# Patient Record
Sex: Male | Born: 1980 | Race: White | Hispanic: No | Marital: Married | Smoking: Never smoker
Health system: Southern US, Community
[De-identification: ages and names within clinical notes are randomized; demographics above are authoritative.]

## PROBLEM LIST (undated history)

## (undated) DIAGNOSIS — T7840XA Allergy, unspecified, initial encounter: Secondary | ICD-10-CM

## (undated) DIAGNOSIS — R519 Headache, unspecified: Secondary | ICD-10-CM

## (undated) DIAGNOSIS — G4730 Sleep apnea, unspecified: Secondary | ICD-10-CM

## (undated) HISTORY — DX: Headache, unspecified: R51.9

## (undated) HISTORY — DX: Allergy, unspecified, initial encounter: T78.40XA

## (undated) HISTORY — DX: Sleep apnea, unspecified: G47.30

---

## 2019-03-23 ENCOUNTER — Encounter: Payer: Self-pay | Admitting: Family Medicine

## 2019-03-23 ENCOUNTER — Other Ambulatory Visit: Payer: Self-pay

## 2019-03-23 ENCOUNTER — Ambulatory Visit (INDEPENDENT_AMBULATORY_CARE_PROVIDER_SITE_OTHER): Payer: Managed Care, Other (non HMO) | Admitting: Family Medicine

## 2019-03-23 VITALS — BP 118/70 | HR 88 | Ht 71.0 in | Wt 224.0 lb

## 2019-03-23 DIAGNOSIS — Z7689 Persons encountering health services in other specified circumstances: Secondary | ICD-10-CM

## 2019-03-23 DIAGNOSIS — G4484 Primary exertional headache: Secondary | ICD-10-CM | POA: Diagnosis not present

## 2019-03-23 DIAGNOSIS — Z23 Encounter for immunization: Secondary | ICD-10-CM | POA: Diagnosis not present

## 2019-03-23 DIAGNOSIS — R400 Somnolence: Secondary | ICD-10-CM

## 2019-03-23 DIAGNOSIS — G4733 Obstructive sleep apnea (adult) (pediatric): Secondary | ICD-10-CM | POA: Diagnosis not present

## 2019-03-23 MED ORDER — MODAFINIL 200 MG PO TABS: 200.0000 mg | ORAL_TABLET | Freq: Every day | ORAL | 5 refills | Status: DC

## 2019-03-23 MED ORDER — INDOMETHACIN 25 MG PO CAPS: 25.0000 mg | ORAL_CAPSULE | Freq: Two times a day (BID) | ORAL | 0 refills | Status: DC

## 2019-03-23 NOTE — Patient Instructions (Signed)
Indomethacin-Responsive Headache, Adult An indomethacin-responsive headache is a headache that gets better when you take indomethacin. Indomethacin is a kind of NSAID (nonsteroidal anti-inflammatory drug). Indomethacin can quickly stop the pain from some kinds of headaches, such as:  Paroxysmal hemicrania. This is a series of short, severe headaches, usually on just one side of the head.  Hemicrania continua. Pain is nonstop and on one side of the face.  Primary exertional headache. Exercise sets off these headaches.  Primary cough headache. Pain may come from pressure in the brain when coughing or straining. What are the causes? The exact cause of this condition is not known. Certain conditions may start (trigger) a headache. They include:  Moving the head in certain ways.  Stress.  Pressure on sensitive areas of the neck.  Drinking alcohol.  Exercise.  Coughing and sneezing. What increases the risk? The following factors may make you more likely to develop this condition:  Being 52 years of age or older.  Having a serious head injury.  Having migraine headaches.  Having a family history of this condition. What are the signs or symptoms? Symptoms of this condition depend on the kind of headache you have.  Paroxysmal hemicrania: ? Having about 10 headaches a day. Each may last from a few minutes to 2 hours. ? Severe, pounding pain. ? Pain usually on just one side of the head. It often centers around the eye or in the forehead. ? A watery eye, which may become red or swollen. ? A droopy or swollen eyelid. ? Sweating and having a red or pinkish face. ? A stuffy, runny nose.  Hemicrania continua: ? All-day headache. This may occur daily for at least 3 months. Then there may be no headaches for weeks or months. ? Pain that gets worse several times during the day. ? Pain in the face, on one side only. It almost always occurs on the same side. ? A watery eye. It may also  become droopy, red, and swollen. ? A stuffy, runny nose. ? Pain that gets worse with sound or light.  Primary exertional headache: ? Pain during physical activity. ? Pounding or throbbing pain. ? Pain that lasts for 5 minutes to 48 hours, or sometimes longer.  Primary cough headache: ? Pain that starts after coughing, sneezing, or straining. ? Sharp, stabbing pain. ? Pain on both sides of the head. It is often worse in the back of the head. ? Pain that is severe for a few minutes and then dull for several hours. How is this diagnosed? This condition may be diagnosed based on:  Symptoms and medical history. Your health care provider will ask you questions about your headaches.  Physical exam.  Tests that may include: ? Blood and urine tests. ? Spinal tap (lumbar puncture). This tests a sample of fluid from your spine. The test checks for infection, bleeding in your brain (brain hemorrhage), or extra pressure inside your skull. ? Ultrasound, MRI, CT scan, or other imaging tests. If no medical condition is causing your headaches, you will be given indomethacin. If yours is an indomethacin-responsive headache, your symptoms should go away quickly. How is this treated?  By taking indomethacin.  With other medicines: ? To prevent or treat stomach ulcers. ? To relieve stomachache or heartburn (antacids). ? For nausea. Follow these instructions at home: Lifestyle  Rest in a dark, quiet room.  Put a cool, damp washcloth on your head or face.  Get plenty of sleep. Most adults should get  at least 7-9 hours of sleep each night.  Eat on a regular schedule. Do not skip meals.  Limit alcohol intake to no more than 1 drink per day for non-pregnant women and 2 drinks per day for men. One drink equals 12 ounces of beer, 5 ounces of wine, or 1 ounces of hard liquor.  Do not use any products that contain nicotine or tobacco, such as cigarettes and e-cigarettes. If you need help quitting,  ask your health care provider. Headache diary Keep a headache diary. This will help you and your health care provider determine what is triggering your headaches. Each time you have a headache, write down:  When it started and stopped. Include the day and time.  How it felt.  Any triggers, such as noise, stress, or foods.  Any medicines you took.  General instructions  Take over-the-counter and prescription medicines only as told by your health care provider. ? Do not take other NSAIDs, such as ibuprofen, with indomethacin.  Tell your health care provider about all medicines you are taking, including vitamins, herbs, eye drops, creams, and over-the-counter medicines.  Keep all follow-up visits as told by your health care provider. This is important. Contact a health care provider if:  Your pain continues even with treatment.  You have nausea.  You have a fever. Get help right away if:  You have bad stomach pain or vomiting.  You vomit blood.  You have blood in your stool.  You have chest pain.  You have any symptoms of a stroke. "BE FAST" is an easy way to remember the main warning signs of a stroke: ? B - Balance. Signs are dizziness, sudden trouble walking, or loss of balance. ? E - Eyes. Signs are trouble seeing or a sudden change in vision. ? F - Face. Signs are sudden weakness or numbness of the face, or the face or eyelid drooping on one side. ? A - Arms. Signs are weakness or numbness in an arm. This happens suddenly and usually on one side of the body. ? S - Speech. Signs are sudden trouble speaking, slurred speech, or trouble understanding what people say. ? T - Time. Time to call emergency services. Write down what time symptoms started.  You have other signs of a stroke, such as: ? A sudden, severe headache. ? Nausea or vomiting. ? Seizure. Summary  An indomethacin-responsive headache is a headache that gets better when you take indomethacin, a medicine  that stops inflammation.  The exact cause of this condition is not known, but there are certain conditions that may start (trigger) a headache.  Keep a headache diary to help your health care provider determine your triggers.  Treatment of this condition includes indomethacin, but it may include other medicines to relieve other symptoms. This information is not intended to replace advice given to you by your health care provider. Make sure you discuss any questions you have with your health care provider. Document Released: 06/30/2009 Document Revised: 09/04/2018 Document Reviewed: 07/23/2017 Elsevier Patient Education  2020 [employer].

## 2019-03-23 NOTE — Progress Notes (Signed)
Date:  03/23/2019   Name:  George Mercer   DOB:  [birth year]   MRN:  [id number]   Chief Complaint: Establish Care and influenza vacc need  Patient is a 38 year old male who presents for an establish care exam. The patient reports the following problems:none . Health maintenance has been reviewed up to date.   Review of Systems  Constitutional: Negative for chills and fever.  HENT: Negative for drooling, ear discharge, ear pain, postnasal drip and sore throat.   Respiratory: Negative for cough, shortness of breath and wheezing.   Cardiovascular: Negative for chest pain, palpitations and leg swelling.  Gastrointestinal: Negative for abdominal pain, blood in stool, constipation, diarrhea and nausea.  Endocrine: Negative for polydipsia.  Genitourinary: Negative for dysuria, frequency, hematuria and urgency.  Musculoskeletal: Negative for back pain, myalgias and neck pain.  Skin: Negative for rash.  Allergic/Immunologic: Negative for environmental allergies.  Neurological: Positive for headaches. Negative for dizziness, seizures, speech difficulty and numbness.  Hematological: Does not bruise/bleed easily.  Psychiatric/Behavioral: Negative for suicidal ideas. The patient is not nervous/anxious.     There are no active problems to display for this patient.   No Known Allergies  History reviewed. No pertinent surgical history.  Social History   Tobacco Use  . Smoking status: Never Smoker  . Smokeless tobacco: Never Used  Substance Use Topics  . Alcohol use: Yes    Comment: 1-2 beers/ week  . Drug use: Never     Medication list has been reviewed and updated.  Current Meds  Medication Sig  . fluticasone (FLONASE) 50 MCG/ACT nasal spray Place 1 spray into both nostrils daily. otc    PHQ 2/9 Scores 03/23/2019  PHQ - 2 Score 0  PHQ- 9 Score 3    BP Readings from Last 3 Encounters:  03/23/19 118/70    Physical Exam Vitals signs and nursing note reviewed.  HENT:     Head: Normocephalic.     Right Ear: External ear normal.     Left Ear: External ear normal.     Nose: Nose normal.  Eyes:     General: No scleral icterus.       Right eye: No discharge.        Left eye: No discharge.     Conjunctiva/sclera: Conjunctivae normal.     Pupils: Pupils are equal, round, and reactive to light.  Neck:     Musculoskeletal: Normal range of motion and neck supple.     Thyroid: No thyromegaly.     Vascular: No JVD.     Trachea: No tracheal deviation.  Cardiovascular:     Rate and Rhythm: Normal rate and regular rhythm.     Heart sounds: Normal heart sounds. No murmur. No friction rub. No gallop.   Pulmonary:     Effort: No respiratory distress.     Breath sounds: Normal breath sounds. No wheezing or rales.  Abdominal:     General: Bowel sounds are normal.     Palpations: Abdomen is soft. There is no mass.     Tenderness: There is no abdominal tenderness. There is no guarding or rebound.  Musculoskeletal: Normal range of motion.        General: No tenderness.  Lymphadenopathy:     Cervical: No cervical adenopathy.  Skin:    General: Skin is warm.     Findings: No rash.  Neurological:     Mental Status: He is alert and oriented to person, place, and time.  Cranial Nerves: No cranial nerve deficit.     Deep Tendon Reflexes: Reflexes are normal and symmetric.     Wt Readings from Last 3 Encounters:  03/23/19 224 lb (101.6 kg)    BP 118/70   Pulse 88   Ht 5\' 11"  (1.803 m)   Wt 224 lb (101.6 kg)   BMI 31.24 kg/m   Assessment and Plan:  1. Establishing care with new doctor, encounter for Patient to establish care with new physician.  Reviewed patient's medical history and physical exam was unremarkable.  2. Obstructive sleep apnea syndrome Patient has a history of obstructive sleep apnea for which he is on CPAP.  Patient will be getting his settings so we can go ahead and put it in his media and for future purposes of if we need to order new  supplies.  At this time otherwise it may be difficult in the future  3. Daytime somnolence Patient is been on Provigil in the past 1-1/2 tablets daily we will will continue at the current dose of 1-1/2 tablet. - modafinil (PROVIGIL) 200 MG tablet; Take 1 tablet (200 mg total) by mouth daily. One and a half a day  Dispense: 45 tablet; Refill: 5  4. 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  We will try some indomethacin 25 mg twice a day either at the time of the early symptoms or prophylactic when he is working out in the gym's and maxing out on weights. - indomethacin (INDOCIN) 25 MG capsule; Take 1 capsule (25 mg total) by mouth 2 (two) times daily with a meal.  Dispense: 30 capsule; Refill: 0  5. Influenza vaccine needed Discussed and administered. - Flu Vaccine QUAD 6+ mos PF IM (Fluarix Quad PF)

## 2019-03-26 ENCOUNTER — Encounter: Payer: Self-pay | Admitting: Family Medicine

## 2019-03-30 ENCOUNTER — Other Ambulatory Visit: Payer: Self-pay

## 2019-03-30 DIAGNOSIS — R400 Somnolence: Secondary | ICD-10-CM

## 2019-03-30 MED ORDER — MODAFINIL 200 MG PO TABS: 200.0000 mg | ORAL_TABLET | Freq: Two times a day (BID) | ORAL | 5 refills | Status: DC

## 2019-04-03 ENCOUNTER — Telehealth: Payer: Self-pay

## 2019-04-03 NOTE — Telephone Encounter (Signed)
Pt called last week requesting #60 of Pristiq to be sent to pharmacy d/t #60 being "cheaper than #30". Pt was notified today on voicemail that we will be unable to continue to prescribe the twice a day dosing, as the PA for the 30 was denied. Offered to send him to the doctor of his choice, if he wanted this continued. Pt called back stating that he is "fighting the denial." But he "understood and didn't want to cause an issue for [place]." I called [facility] pharm and spoke to Megan/ told her to d/c the Pristiq they have on file, so there will not be 2 open prescriptions for this med.

## 2019-05-16 ENCOUNTER — Encounter: Payer: Self-pay | Admitting: Family Medicine

## 2019-05-16 ENCOUNTER — Other Ambulatory Visit: Payer: Self-pay

## 2019-05-16 ENCOUNTER — Ambulatory Visit (INDEPENDENT_AMBULATORY_CARE_PROVIDER_SITE_OTHER): Payer: Managed Care, Other (non HMO) | Admitting: Family Medicine

## 2019-05-16 VITALS — BP 118/77 | HR 88 | Resp 16 | Ht 71.0 in | Wt 228.0 lb

## 2019-05-16 DIAGNOSIS — J301 Allergic rhinitis due to pollen: Secondary | ICD-10-CM

## 2019-05-16 DIAGNOSIS — R400 Somnolence: Secondary | ICD-10-CM

## 2019-05-16 DIAGNOSIS — G4484 Primary exertional headache: Secondary | ICD-10-CM | POA: Diagnosis not present

## 2019-05-16 MED ORDER — AZELASTINE HCL 0.1 % NA SOLN: 1.0000 | Freq: Two times a day (BID) | NASAL | 12 refills | Status: DC

## 2019-05-16 MED ORDER — INDOMETHACIN 50 MG PO CAPS: 50.0000 mg | ORAL_CAPSULE | Freq: Two times a day (BID) | ORAL | 3 refills | Status: DC

## 2019-05-16 MED ORDER — MODAFINIL 100 MG PO TABS: 100.0000 mg | ORAL_TABLET | Freq: Two times a day (BID) | ORAL | 5 refills | Status: DC

## 2019-05-16 NOTE — Progress Notes (Signed)
Date:  05/16/2019   Name:  George Mercer   DOB:  1980/11/06   MRN:  [id number]   Chief Complaint: Migraine (Got worse wants lower dose of Modafinil and increase Indomethasin  )  Patient is a 38 year old male who presents for a daytime somnolence exam. The patient reports the following problems: headache. Health maintenance has been reviewed up to date.  Headache  This is a chronic problem. The current episode started more than 1 year ago. The problem occurs intermittently. The pain is located in the occipital region. The pain does not radiate. The pain quality is similar to prior headaches. The quality of the pain is described as throbbing. The pain is at a severity of 10/10. The pain is moderate. Pertinent negatives include no abdominal pain, abnormal behavior, anorexia, back pain, blurred vision, coughing, dizziness, drainage, ear pain, eye pain, eye redness, eye watering, facial sweating, fever, hearing loss, insomnia, loss of balance, muscle aches, nausea, neck pain, numbness, phonophobia, photophobia, rhinorrhea, scalp tenderness, seizures, sinus pressure, sore throat, swollen glands, tingling, tinnitus, visual change, vomiting, weakness or weight loss. Exacerbated by: exercise. He has tried NSAIDs for the symptoms. The treatment provided moderate relief. There is no history of recent head traumas.    Review of Systems  Constitutional: Negative for chills, fever and weight loss.  HENT: Negative for drooling, ear discharge, ear pain, hearing loss, rhinorrhea, sinus pressure, sore throat and tinnitus.   Eyes: Negative for blurred vision, photophobia, pain and redness.  Respiratory: Negative for cough, shortness of breath and wheezing.   Cardiovascular: Negative for chest pain, palpitations and leg swelling.  Gastrointestinal: Negative for abdominal pain, anorexia, blood in stool, constipation, diarrhea, nausea and vomiting.  Endocrine: Negative for polydipsia.  Genitourinary: Negative  for dysuria, frequency, hematuria and urgency.  Musculoskeletal: Negative for back pain, myalgias and neck pain.  Skin: Negative for rash.  Allergic/Immunologic: Negative for environmental allergies.  Neurological: Positive for headaches. Negative for dizziness, tingling, seizures, weakness, numbness and loss of balance.  Hematological: Does not bruise/bleed easily.  Psychiatric/Behavioral: Negative for suicidal ideas. The patient is not nervous/anxious and does not have insomnia.     There are no active problems to display for this patient.   No Known Allergies  History reviewed. No pertinent surgical history.  Social History   Tobacco Use  . Smoking status: Never Smoker  . Smokeless tobacco: Never Used  Substance Use Topics  . Alcohol use: Yes    Comment: 1-2 beers/ week  . Drug use: Never     Medication list has been reviewed and updated.  Current Meds  Medication Sig  . fluticasone (FLONASE) 50 MCG/ACT nasal spray Place 1 spray into both nostrils daily. otc  . indomethacin (INDOCIN) 25 MG capsule Take 1 capsule (25 mg total) by mouth 2 (two) times daily with a meal.  . modafinil (PROVIGIL) 200 MG tablet Take 1 tablet (200 mg total) by mouth 2 (two) times daily.    PHQ 2/9 Scores 05/16/2019 03/23/2019  PHQ - 2 Score 1 0  PHQ- 9 Score 1 3    BP Readings from Last 3 Encounters:  05/16/19 118/77  03/23/19 118/70    Physical Exam Vitals signs and nursing note reviewed.  HENT:     Head: Normocephalic.     Right Ear: Tympanic membrane, ear canal and external ear normal. There is no impacted cerumen.     Left Ear: Tympanic membrane, ear canal and external ear normal. There is no impacted  cerumen.     Nose: Nose normal. No congestion or rhinorrhea.  Eyes:     General: No scleral icterus.       Right eye: No discharge.        Left eye: No discharge.     Conjunctiva/sclera: Conjunctivae normal.     Pupils: Pupils are equal, round, and reactive to light.  Neck:      Musculoskeletal: Normal range of motion and neck supple.     Thyroid: No thyromegaly.     Vascular: No carotid bruit or JVD.     Trachea: No tracheal deviation.  Cardiovascular:     Rate and Rhythm: Normal rate and regular rhythm.     Heart sounds: Normal heart sounds. No murmur. No friction rub. No gallop.   Pulmonary:     Effort: No respiratory distress.     Breath sounds: Normal breath sounds. No wheezing or rales.  Abdominal:     General: Bowel sounds are normal.     Palpations: Abdomen is soft. There is no mass.     Tenderness: There is no abdominal tenderness. There is no guarding or rebound.  Musculoskeletal: Normal range of motion.        General: No tenderness.  Lymphadenopathy:     Cervical: No cervical adenopathy.  Skin:    General: Skin is warm.     Findings: No rash.  Neurological:     Mental Status: He is alert and oriented to person, place, and time.     Cranial Nerves: No cranial nerve deficit.     Deep Tendon Reflexes: Reflexes are normal and symmetric.     Wt Readings from Last 3 Encounters:  05/16/19 228 lb (103.4 kg)  03/23/19 224 lb (101.6 kg)    BP 118/77   Pulse 88   Resp 16   Ht 5\' 11"  (1.803 m)   Wt 228 lb (103.4 kg)   SpO2 96%   BMI 31.80 kg/m   Assessment and Plan:  1. Daytime somnolence Patient has an issue with daytime somnolence which she needs to take Provigil in the past.  However the current dosing of 200 mg his causing some headaches and he has tried taking a half and that seems to decrease the frequency of his headaches.  Therefore we will decrease dosing to 100 mg with the option to taking 2 on a as needed basis.  Medication cornea is Provigil 100 mg 1-2 a day with refills. - modafinil (PROVIGIL) 100 MG tablet; Take 1 tablet (100 mg total) by mouth 2 (two) times daily. May take one or two(bid) option  Dispense: 30 tablet; Refill: 5  2. 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  Patient would like to go up on the dosing to 50 mg twice a day to take on an as-needed basis when he feels the early onset of the headache to prevent it.  This seems to be on the basis of coverage at this time - indomethacin (INDOCIN) 50 MG capsule; Take 1 capsule (50 mg total) by mouth 2 (two) times daily with a meal. Prn headache concern  Dispense: 60 capsule; Refill: 3  3. Seasonal allergic rhinitis due to pollen Patient has CPAP which I reviewed and has some nasal congestion he is already on Flonase so we will add all Astelin may be spray in each nostril at night until may be the first frost and maybe this will be an issue. - azelastine (  ASTELIN) 0.1 % nasal spray; Place 1 spray into both nostrils 2 (two) times daily. Use in each nostril as directed  Dispense: 30 mL; Refill: 12

## 2019-05-17 ENCOUNTER — Encounter: Payer: Self-pay | Admitting: Family Medicine

## 2019-05-21 ENCOUNTER — Other Ambulatory Visit: Payer: Self-pay

## 2019-05-21 DIAGNOSIS — R400 Somnolence: Secondary | ICD-10-CM

## 2019-05-21 MED ORDER — MODAFINIL 100 MG PO TABS: 100.0000 mg | ORAL_TABLET | Freq: Every day | ORAL | 0 refills | Status: DC

## 2019-05-22 ENCOUNTER — Telehealth: Payer: Self-pay

## 2019-05-22 NOTE — Telephone Encounter (Signed)
Insurance call in process

## 2019-05-22 NOTE — Telephone Encounter (Signed)
I faxed over PA for Provigil 100mg  qday on 05/21/2019 to [place]. I then received a form from [place] today that had last name of doctor typed on form and also typed was Quantity of 30 per 8 days. I called [employer] to find out what was going on. They stated they never received form from yesterday, therefore I faxed it again and received confirmation that it went through. I spent an hour and a half on the phone with [employer] trying to figure out what is going on with the forms. I called the pt to ask what dosing he is taking and what pharmacy is he using. He said 100mg  daily and [facility] [place]. I explained to him Dr Ronnald Ramp only wants him to take 100mg  daily and I would be calling the pharm to discontinue the other prescriptions for Provigil. I called [facility] and spoke to Mickel Baas- gave her the 100mg  daily on the Provigil and d/c'd the 200mg s. The pt did state that "I hope you know I wouldn't do this and can you check my PMP." I explained to him that I did not have access to that and Dr Ronnald Ramp is out sick,  But if there is a problem, he will be notified.

## 2019-09-03 ENCOUNTER — Other Ambulatory Visit: Payer: Self-pay | Admitting: Family Medicine

## 2019-09-03 DIAGNOSIS — R400 Somnolence: Secondary | ICD-10-CM

## 2019-11-07 ENCOUNTER — Encounter: Payer: Self-pay | Admitting: Family Medicine

## 2019-11-07 ENCOUNTER — Other Ambulatory Visit: Payer: Self-pay

## 2019-11-07 ENCOUNTER — Ambulatory Visit (INDEPENDENT_AMBULATORY_CARE_PROVIDER_SITE_OTHER): Payer: Managed Care, Other (non HMO) | Admitting: Family Medicine

## 2019-11-07 VITALS — BP 138/80 | HR 80 | Ht 71.0 in | Wt 220.0 lb

## 2019-11-07 DIAGNOSIS — G4733 Obstructive sleep apnea (adult) (pediatric): Secondary | ICD-10-CM

## 2019-11-07 DIAGNOSIS — J301 Allergic rhinitis due to pollen: Secondary | ICD-10-CM | POA: Diagnosis not present

## 2019-11-07 MED ORDER — MONTELUKAST SODIUM 10 MG PO TABS: 10.0000 mg | ORAL_TABLET | Freq: Every day | ORAL | 11 refills | Status: AC

## 2019-11-07 MED ORDER — FLUTICASONE PROPIONATE 50 MCG/ACT NA SUSP: 1.0000 | Freq: Every day | NASAL | 11 refills | Status: AC

## 2019-11-07 NOTE — Progress Notes (Signed)
Date:  11/07/2019   Name:  George Mercer   DOB:  30-Oct-1980   MRN:  [id number]   Chief Complaint: Follow-up ("has not been needing the modafinil for past couple of months")  URI  This is a chronic problem. The current episode started more than 1 year ago. The problem has been waxing and waning. There has been no fever. Associated symptoms include congestion, rhinorrhea and sneezing. Pertinent negatives include no abdominal pain, chest pain, coughing, diarrhea, dysuria, ear pain, headaches, joint pain, joint swelling, nausea, neck pain, plugged ear sensation, rash, sinus pain, sore throat, swollen glands, vomiting or wheezing. He has tried nothing for the symptoms. The treatment provided moderate relief.    No results found for: CREATININE, BUN, NA, K, CL, CO2 No results found for: CHOL, HDL, LDLCALC, LDLDIRECT, TRIG, CHOLHDL No results found for: TSH No results found for: HGBA1C No results found for: WBC, HGB, HCT, MCV, PLT No results found for: ALT, AST, GGT, ALKPHOS, BILITOT   Review of Systems  Constitutional: Negative for chills and fever.  HENT: Positive for congestion, postnasal drip, rhinorrhea and sneezing. Negative for drooling, ear discharge, ear pain, hearing loss, sinus pain and sore throat.   Respiratory: Negative for cough, shortness of breath and wheezing.   Cardiovascular: Negative for chest pain, palpitations and leg swelling.  Gastrointestinal: Negative for abdominal pain, blood in stool, constipation, diarrhea, nausea and vomiting.  Endocrine: Negative for polydipsia.  Genitourinary: Negative for dysuria, frequency, hematuria and urgency.  Musculoskeletal: Negative for back pain, joint pain, myalgias and neck pain.  Skin: Negative for rash.  Allergic/Immunologic: Negative for environmental allergies.  Neurological: Negative for dizziness and headaches.  Hematological: Does not bruise/bleed easily.  Psychiatric/Behavioral: Negative for suicidal ideas. The  patient is not nervous/anxious.     There are no problems to display for this patient.   No Known Allergies  No past surgical history on file.  Social History   Tobacco Use  . Smoking status: Never Smoker  . Smokeless tobacco: Never Used  Substance Use Topics  . Alcohol use: Yes    Comment: 1-2 beers/ week  . Drug use: Never     Medication list has been reviewed and updated.  Current Meds  Medication Sig  . fluticasone (FLONASE) 50 MCG/ACT nasal spray Place 1 spray into both nostrils daily. otc    PHQ 2/9 Scores 11/07/2019 05/16/2019 03/23/2019  PHQ - 2 Score 0 1 0  PHQ- 9 Score 1 1 3     BP Readings from Last 3 Encounters:  11/07/19 138/80  05/16/19 118/77  03/23/19 118/70    Physical Exam Vitals and nursing note reviewed.  Constitutional:      Appearance: Normal appearance. He is well-developed, well-groomed and normal weight.  HENT:     Head: Normocephalic.     Jaw: There is normal jaw occlusion.     Right Ear: Hearing, tympanic membrane, ear canal and external ear normal. There is no impacted cerumen.     Left Ear: Hearing, tympanic membrane, ear canal and external ear normal. There is no impacted cerumen.     Nose: Nose normal. No congestion or rhinorrhea.     Right Turbinates: Not enlarged, swollen or pale.     Left Turbinates: Not enlarged, swollen or pale.  Eyes:     General: No scleral icterus.       Right eye: No discharge.        Left eye: No discharge.     Conjunctiva/sclera: Conjunctivae normal.  Pupils: Pupils are equal, round, and reactive to light.  Neck:     Thyroid: No thyromegaly.     Vascular: No JVD.     Trachea: No tracheal deviation.  Cardiovascular:     Rate and Rhythm: Normal rate and regular rhythm.     Pulses: Normal pulses.     Heart sounds: Normal heart sounds, S1 normal and S2 normal. No murmur. No systolic murmur. No diastolic murmur. No friction rub. No gallop. No S3 or S4 sounds.   Pulmonary:     Effort: No  respiratory distress.     Breath sounds: Normal breath sounds. No decreased breath sounds, wheezing, rhonchi or rales.  Abdominal:     General: Bowel sounds are normal.     Palpations: Abdomen is soft. There is no hepatomegaly, splenomegaly or mass.     Tenderness: There is no abdominal tenderness. There is no guarding or rebound.  Musculoskeletal:        General: No tenderness. Normal range of motion.     Cervical back: Full passive range of motion without pain, normal range of motion and neck supple.  Lymphadenopathy:     Cervical: No cervical adenopathy.  Skin:    General: Skin is warm.     Findings: No rash.  Neurological:     Mental Status: He is alert and oriented to person, place, and time.     Cranial Nerves: No cranial nerve deficit.     Deep Tendon Reflexes: Reflexes are normal and symmetric.     Wt Readings from Last 3 Encounters:  11/07/19 220 lb (99.8 kg)  05/16/19 228 lb (103.4 kg)  03/23/19 224 lb (101.6 kg)    BP 138/80   Pulse 80   Ht 5\' 11"  (1.803 m)   Wt 220 lb (99.8 kg)   BMI 30.68 kg/m   Assessment and Plan:   1. Seasonal allergic rhinitis due to pollen Chronic.  Episodic.  Currently active.  Patient has some control with the Flonase 1 spray each nostril twice a day as well as loratadine.  Further discussions for the possibility of taking 30 mg of Sudafed and patient has not found to had significant results with the Astelin.  Prescription of Singulair 10 mg was provided to approach this from a leukotriene inhibition standpoint. - fluticasone (FLONASE) 50 MCG/ACT nasal spray; Place 1 spray into both nostrils daily. otc  Dispense: 16 g; Refill: 11 - montelukast (SINGULAIR) 10 MG tablet; Take 1 tablet (10 mg total) by mouth at bedtime.  Dispense: 30 tablet; Refill: 11  2. Obstructive sleep apnea syndrome This is improved since patient has lost some weight and therefore has not noted the daytime somnolence and has not required the use of the Provigil.  We  will continue to monitor in the meantime we will hold on continuance of the Provigil at this time.  Patient also has noted no exertional headaches while working out in gym's has not needed the use of indomethacin.  Patient is anticipating in physical coming with insurance which will include lab work and we will defer until that is done but have offered if need a physical in 6 months at his discretion.

## 2020-05-24 ENCOUNTER — Ambulatory Visit: Payer: Self-pay

## 2020-05-26 ENCOUNTER — Other Ambulatory Visit: Payer: Self-pay

## 2020-05-26 ENCOUNTER — Ambulatory Visit
Admission: RE | Admit: 2020-05-26 | Discharge: 2020-05-26 | Disposition: A | Discharge status: Home / Self Care | Payer: 59 | Source: Ambulatory Visit | Attending: Family Medicine | Admitting: Family Medicine

## 2020-05-26 ENCOUNTER — Ambulatory Visit (INDEPENDENT_AMBULATORY_CARE_PROVIDER_SITE_OTHER): Payer: 59

## 2020-05-26 VITALS — BP 123/71 | HR 72 | Temp 98.3°F | Resp 16 | Ht 71.0 in | Wt 220.0 lb

## 2020-05-26 DIAGNOSIS — S8011XA Contusion of right lower leg, initial encounter: Secondary | ICD-10-CM

## 2020-05-26 DIAGNOSIS — M79661 Pain in right lower leg: Secondary | ICD-10-CM | POA: Diagnosis not present

## 2020-05-26 MED ORDER — MELOXICAM 15 MG PO TABS: 15.0000 mg | ORAL_TABLET | Freq: Every day | ORAL | 0 refills | Status: AC | PRN

## 2020-05-26 NOTE — ED Triage Notes (Signed)
Patient c/o RT lower leg pain since Friday.   Patient states he hit his leg on some dry wall on Friday. Patient has some "numbness, cool feeling, and sharp pain"   Patient has a steady gate. Patient states he has more pain upon flexion of foot and upon activity.   Patient has used Ibuprofen w/ some relief of symptoms.

## 2020-05-26 NOTE — Discharge Instructions (Signed)
Xray negative for fracture. ° °Medication as directed. ° °Take care ° °Dr. Sabriyah Wilcher  °

## 2020-05-27 NOTE — ED Provider Notes (Signed)
[facility]    CSN: [id number] Arrival date & time: 05/26/20  1436  History   Chief Complaint Chief Complaint  Patient presents with  . Leg Pain    HPI  39 year old male presents with leg pain.  Patient reports an injury to his right lower leg on Friday.  He states that he was jumping onto a retaining wall and inadvertently slipped and hit the anterior aspect of his right leg on the wall.  He reports pain over the anterior tibia.  He reports worsening pain with activity and range of motion.  He is taken ibuprofen with some relief.  Patient is concerned he may have a fracture and would like an x-ray today.  He is concerned primarily about return to physical activity.  Past Medical History:  Diagnosis Date  . Allergy   . Generalized headaches    "exertion"  . Sleep apnea    c-pap machine   Home Medications    Prior to Admission medications   Medication Sig Start Date End Date Taking? Authorizing Provider  fluticasone (FLONASE) 50 MCG/ACT nasal spray Place 1 spray into both nostrils daily. otc 11/07/19   Duanne Limerick, MD  meloxicam (MOBIC) 15 MG tablet Take 1 tablet (15 mg total) by mouth daily as needed for pain. 05/26/20   Tommie Sams, DO  montelukast (SINGULAIR) 10 MG tablet Take 1 tablet (10 mg total) by mouth at bedtime. 11/07/19   Duanne Limerick, MD  modafinil (PROVIGIL) 100 MG tablet Take 1 tablet by mouth once daily Patient not taking: Reported on 11/07/2019 09/03/19 05/26/20  Duanne Limerick, MD    Family History Family History  Problem Relation Age of Onset  . Cancer Mother   . Cancer Father   . Diabetes Father   . Cancer Maternal Grandfather     Social History Social History   Tobacco Use  . Smoking status: Never Smoker  . Smokeless tobacco: Never Used  Substance Use Topics  . Alcohol use: Yes    Comment: 1-2 beers/ week  . Drug use: Never     Allergies   Patient has no known allergies.   Review of Systems Review of Systems    Musculoskeletal:       Right leg pain.   Physical Exam Triage Vital Signs ED Triage Vitals  Enc Vitals Group     BP 05/26/20 1446 123/71     Pulse Rate 05/26/20 1446 72     Resp 05/26/20 1446 16     Temp 05/26/20 1446 98.3 F (36.8 C)     Temp Source 05/26/20 1446 Oral     SpO2 05/26/20 1446 100 %     Weight 05/26/20 1444 220 lb 0.3 oz (99.8 kg)     Height 05/26/20 1444 5\' 11"  (1.803 m)     Head Circumference --      Peak Flow --      Pain Score 05/26/20 1444 4     Pain Loc --      Pain Edu? --      Excl. in GC? --    Updated Vital Signs BP 123/71 (BP Location: Right Arm)   Pulse 72   Temp 98.3 F (36.8 C) (Oral)   Resp 16   Ht 5\' 11"  (1.803 m)   Wt 99.8 kg   SpO2 100%   BMI 30.69 kg/m   Visual Acuity Right Eye Distance:   Left Eye Distance:   Bilateral Distance:    Right  Eye Near:   Left Eye Near:    Bilateral Near:     Physical Exam Vitals and nursing note reviewed.  Constitutional:      General: He is not in acute distress.    Appearance: Normal appearance. He is not ill-appearing.  HENT:     Head: Normocephalic and atraumatic.  Eyes:     General:        Right eye: No discharge.        Left eye: No discharge.     Conjunctiva/sclera: Conjunctivae normal.  Pulmonary:     Effort: Pulmonary effort is normal. No respiratory distress.  Musculoskeletal:     Comments: Mild tenderness over the right anterior tibia.   Neurological:     Mental Status: He is alert.  Psychiatric:        Mood and Affect: Mood normal.        Behavior: Behavior normal.    UC Treatments / Results  Labs (all labs ordered are listed, but only abnormal results are displayed) Labs Reviewed - No data to display  EKG   Radiology DG Tibia/Fibula Right  Result Date: 05/26/2020 CLINICAL DATA:  39 year old male status post blunt trauma 3 days ago with continued anterior tib fib pain. EXAM: RIGHT TIBIA AND FIBULA - 2 VIEW COMPARISON:  None. FINDINGS: There is no evidence of  fracture or other focal bone lesions. Bone mineralization is within normal limits. Preserved alignment at the right knee and ankle. There is broad-based anterior soft tissue swelling overlying the tibia, up to 5 mm in thickness. No soft tissue gas. No radiopaque foreign body identified. IMPRESSION: Anterior soft tissue swelling.  No osseous abnormality identified. Electronically Signed   By: Odessa Fleming M.D.   On: 05/26/2020 15:16    Procedures Procedures (including critical care time)  Medications Ordered in UC Medications - No data to display  Initial Impression / Assessment and Plan / UC Course  I have reviewed the triage vital signs and the nursing notes.  Pertinent labs & imaging results that were available during my care of the patient were reviewed by me and considered in my medical decision making (see chart for details).    39 year old male presents with a contusion of the right lower extremity.  X-ray was obtained and independently reviewed by me.  No acute fracture.  Mild swelling was noted.  Meloxicam as needed.  Supportive care.  Final Clinical Impressions(s) / UC Diagnoses   Final diagnoses:  Contusion of right lower extremity, initial encounter     Discharge Instructions     Xray negative for fracture.  Medication as directed.  Take care  Dr. Adriana Simas    ED Prescriptions    Medication Sig Dispense Auth. Provider   meloxicam (MOBIC) 15 MG tablet Take 1 tablet (15 mg total) by mouth daily as needed for pain. 30 tablet Tommie Sams, DO     PDMP not reviewed this encounter.   Tommie Sams, [place] 05/27/20 [phone]

## 2020-11-16 ENCOUNTER — Ambulatory Visit
Admission: RE | Admit: 2020-11-16 | Discharge: 2020-11-16 | Disposition: A | Discharge status: Home / Self Care | Payer: 59 | Source: Ambulatory Visit | Attending: Family Medicine | Admitting: Family Medicine

## 2020-11-16 ENCOUNTER — Other Ambulatory Visit: Payer: Self-pay

## 2022-05-02 IMAGING — CR DG TIBIA/FIBULA 2V*R*
4 series · 4 of 4 positions shown · non-contrast
Comparison: None.

CLINICAL DATA: 39-year-old male status post blunt trauma 3 days ago
with continued anterior tib fib pain.

EXAM:
RIGHT TIBIA AND FIBULA - 2 VIEW

[tibia ap (1 of 2)]
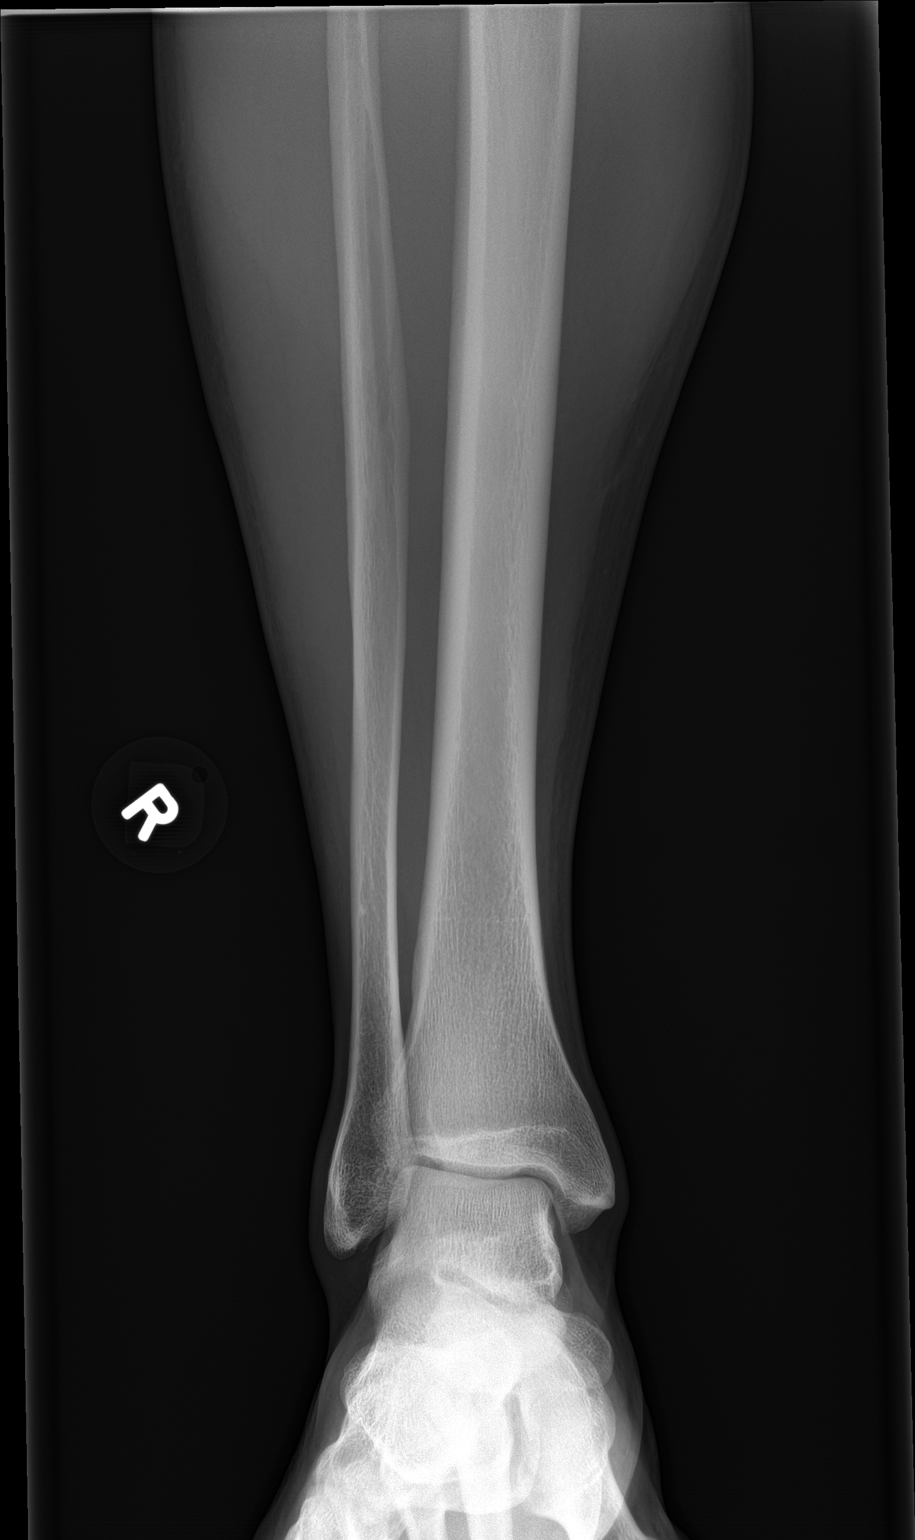

[tibia ap (2 of 2)]
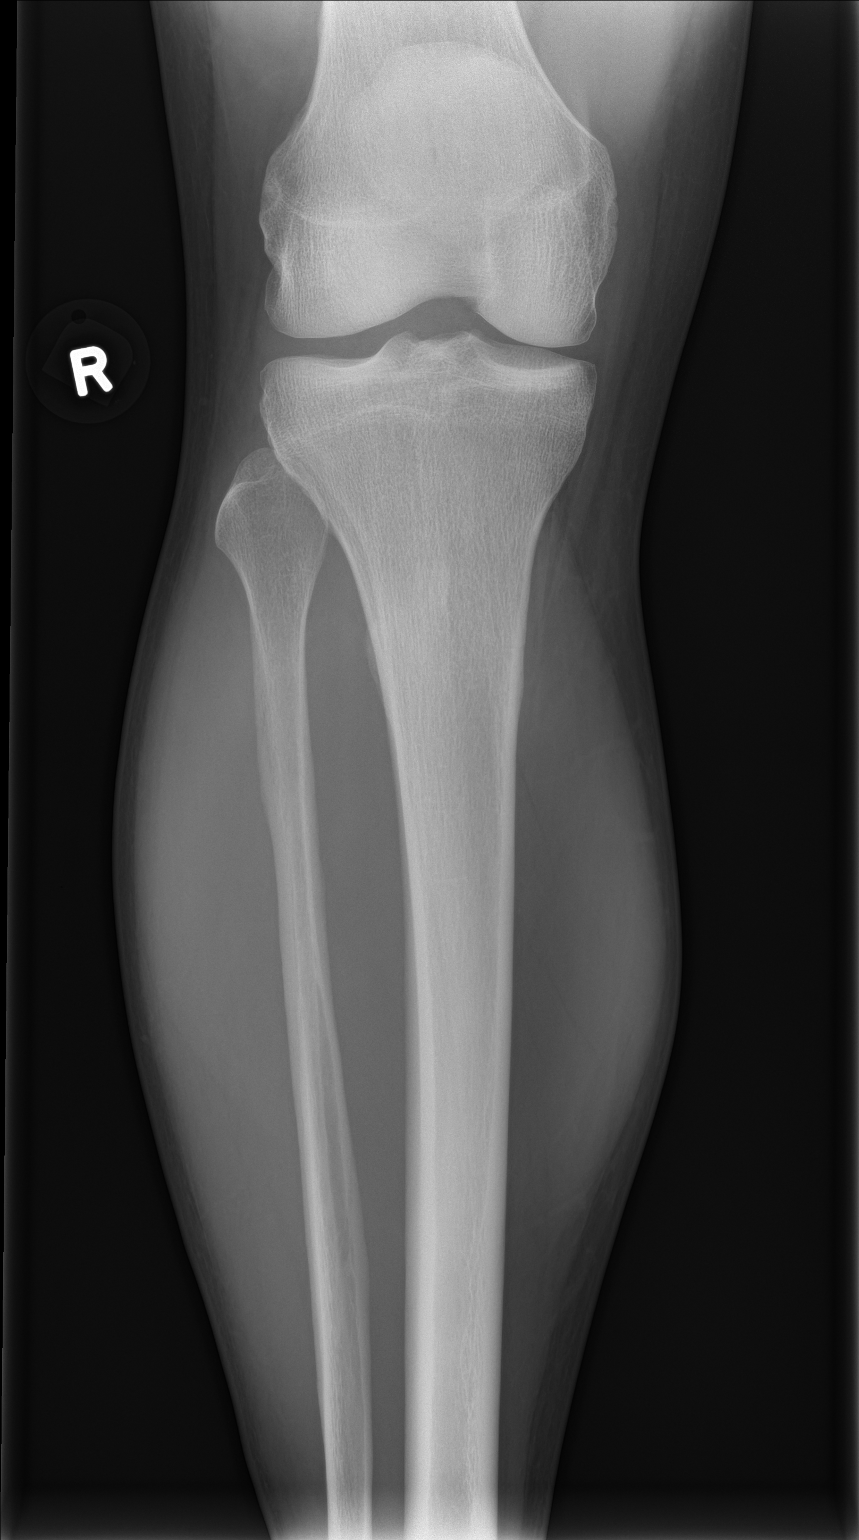

[tibia lat (1 of 2)]
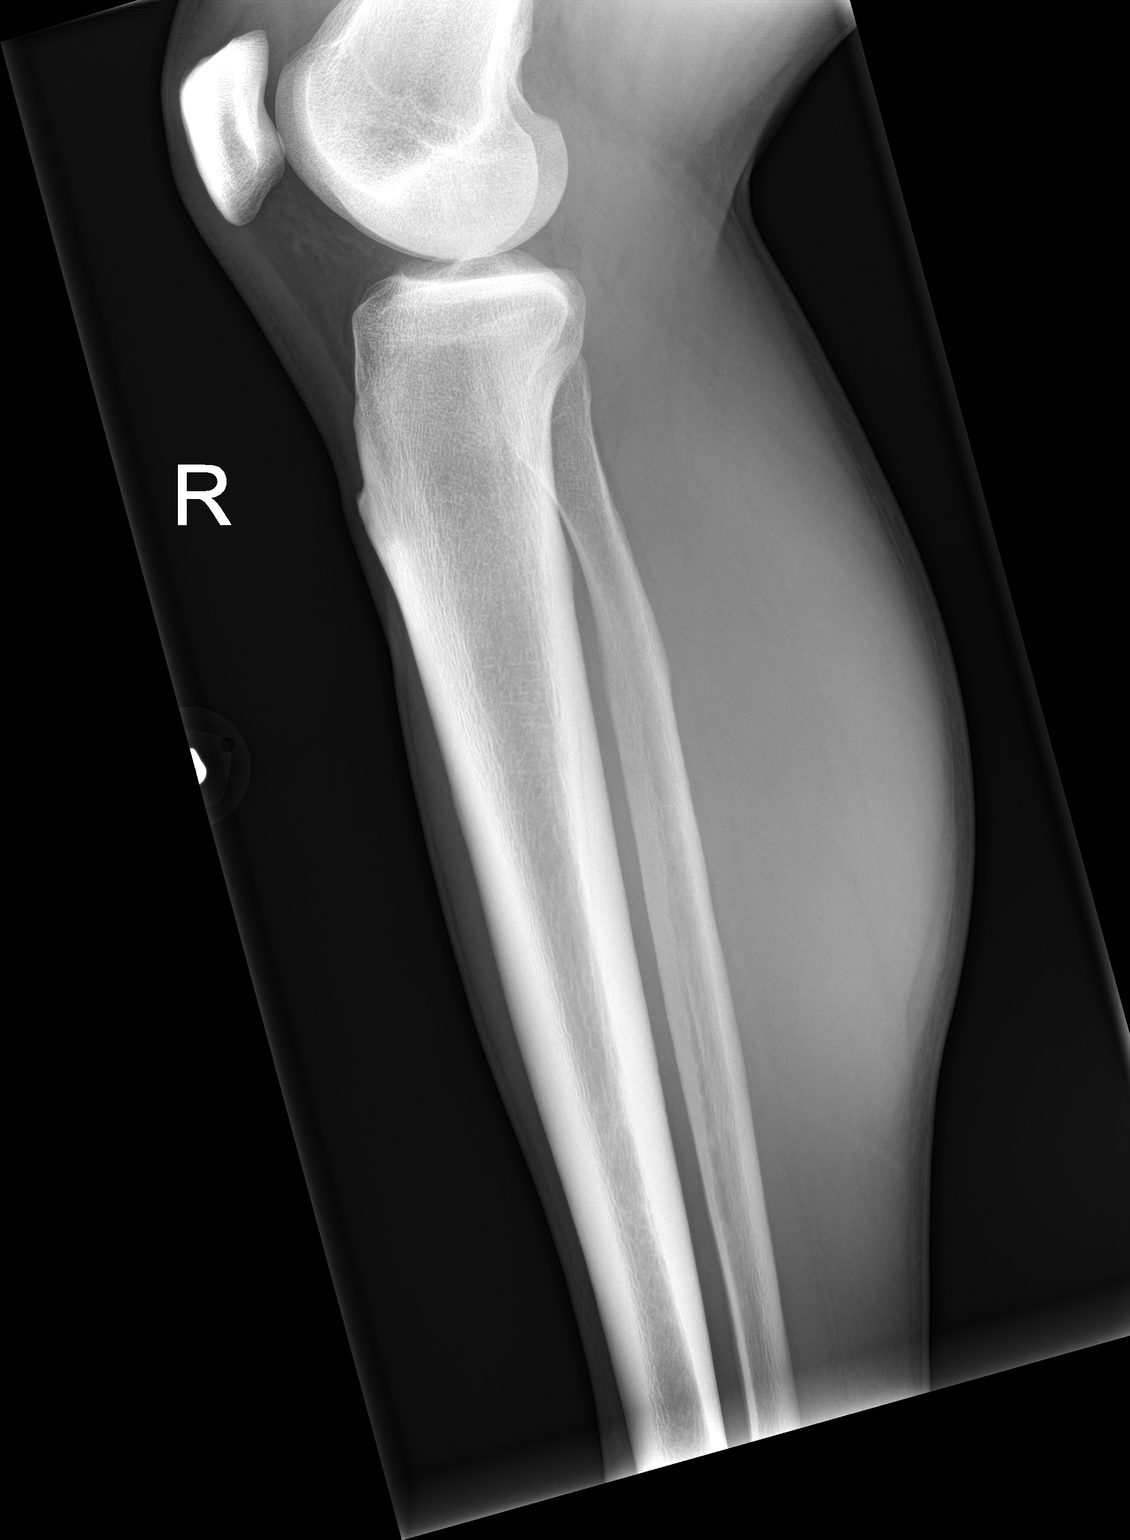

[tibia lat (2 of 2)]
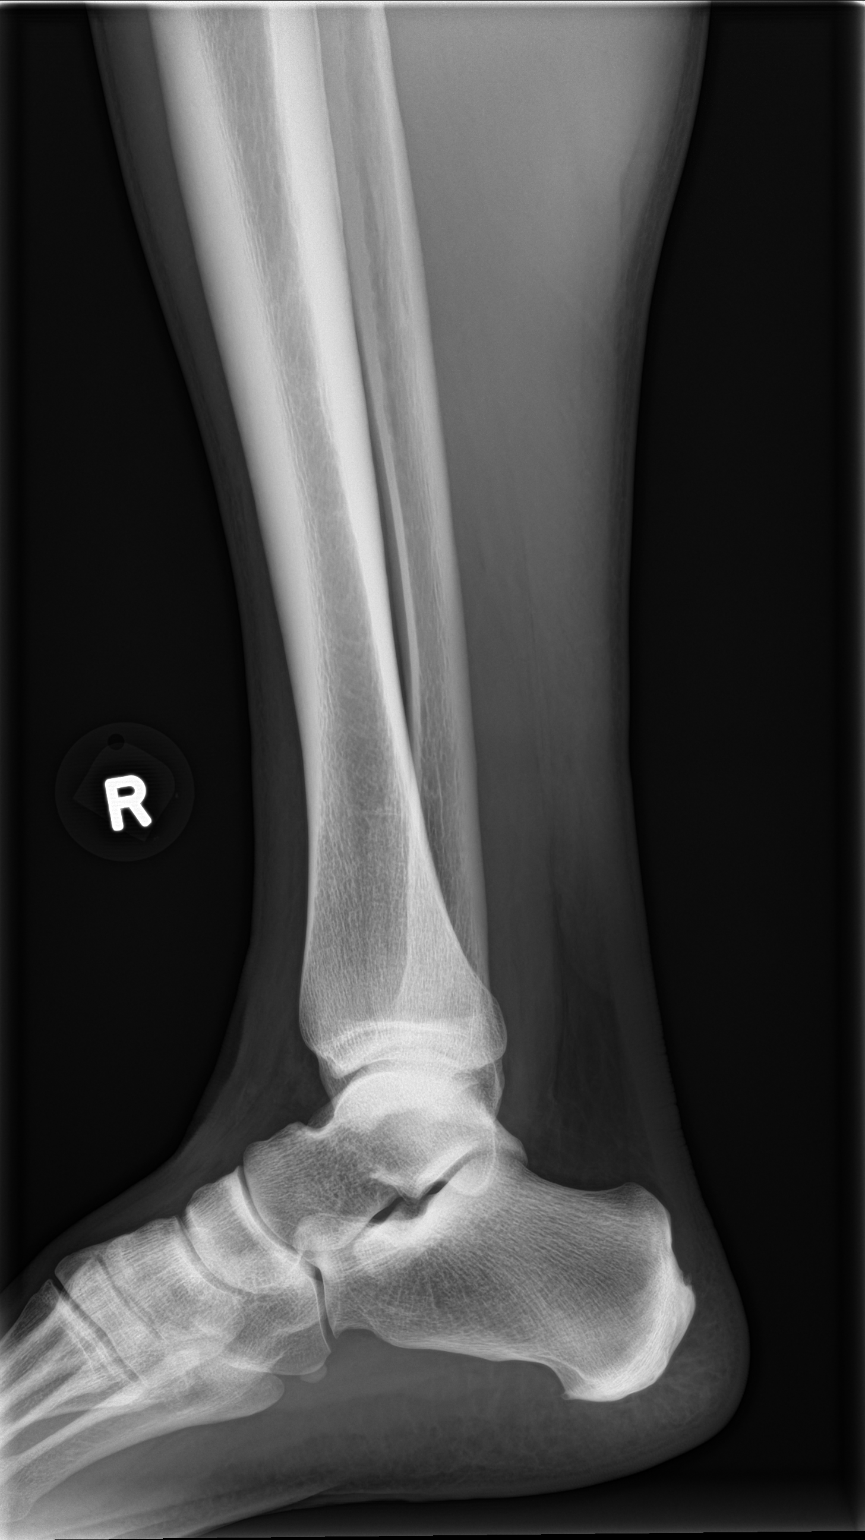

[4 of 4 positions shown; findings below may reference images not displayed]

FINDINGS: There is no evidence of fracture or other focal bone lesions. Bone
mineralization is within normal limits. Preserved alignment at the
right knee and ankle.

There is broad-based anterior soft tissue swelling overlying the
tibia, up to 5 mm in thickness. No soft tissue gas. No radiopaque
foreign body identified.
IMPRESSION: Anterior soft tissue swelling.  No osseous abnormality identified.

## 2023-09-12 ENCOUNTER — Ambulatory Visit: Payer: 59 | Admitting: Anesthesiology

## 2023-09-12 ENCOUNTER — Encounter: Payer: Self-pay | Admitting: Gastroenterology

## 2023-09-12 ENCOUNTER — Encounter
Admission: RE | Disposition: A | Discharge status: Home / Self Care | Payer: Self-pay | Source: Home / Self Care | Attending: Gastroenterology

## 2023-09-12 ENCOUNTER — Ambulatory Visit
Admission: RE | Admit: 2023-09-12 | Discharge: 2023-09-12 | Disposition: A | Discharge status: Home / Self Care | Payer: 59 | Attending: Gastroenterology | Admitting: Gastroenterology

## 2023-09-12 DIAGNOSIS — D122 Benign neoplasm of ascending colon: Secondary | ICD-10-CM | POA: Diagnosis not present

## 2023-09-12 DIAGNOSIS — G4730 Sleep apnea, unspecified: Secondary | ICD-10-CM | POA: Insufficient documentation

## 2023-09-12 DIAGNOSIS — Z1211 Encounter for screening for malignant neoplasm of colon: Secondary | ICD-10-CM | POA: Insufficient documentation

## 2023-09-12 DIAGNOSIS — K635 Polyp of colon: Secondary | ICD-10-CM | POA: Insufficient documentation

## 2023-09-12 DIAGNOSIS — D128 Benign neoplasm of rectum: Secondary | ICD-10-CM | POA: Diagnosis not present

## 2023-09-12 DIAGNOSIS — Z800 Family history of malignant neoplasm of digestive organs: Secondary | ICD-10-CM | POA: Diagnosis not present

## 2023-09-12 HISTORY — PX: HEMOSTASIS CLIP PLACEMENT: SHX6857

## 2023-09-12 HISTORY — PX: COLONOSCOPY WITH PROPOFOL: SHX5780

## 2023-09-12 HISTORY — PX: POLYPECTOMY: SHX5525

## 2023-09-12 SURGERY — COLONOSCOPY WITH PROPOFOL
Anesthesia: General

## 2023-09-12 MED ORDER — PROPOFOL 10 MG/ML IV BOLUS
INTRAVENOUS | Status: AC
  Filled 2023-09-12: qty 40

## 2023-09-12 MED ORDER — SODIUM CHLORIDE 0.9 % IV SOLN: INTRAVENOUS | Status: DC

## 2023-09-12 MED ORDER — PROPOFOL 10 MG/ML IV BOLUS
INTRAVENOUS | Status: AC
  Filled 2023-09-12: qty 20

## 2023-09-12 MED ORDER — PROPOFOL 500 MG/50ML IV EMUL
INTRAVENOUS | Status: DC | PRN
  Administered 2023-09-12 (×2): 25 mg via INTRAVENOUS
  Administered 2023-09-12: 100 mg via INTRAVENOUS
  Administered 2023-09-12: 50 mg via INTRAVENOUS
  Administered 2023-09-12: 120 ug/kg/min via INTRAVENOUS
  Administered 2023-09-12 (×3): 25 mg via INTRAVENOUS

## 2023-09-12 MED ORDER — LIDOCAINE HCL (CARDIAC) PF 100 MG/5ML IV SOSY
PREFILLED_SYRINGE | INTRAVENOUS | Status: DC | PRN
  Administered 2023-09-12: 80 mg via INTRAVENOUS

## 2023-09-12 NOTE — Anesthesia Preprocedure Evaluation (Signed)
 Anesthesia Evaluation  Patient identified by MRN, date of birth, ID band Patient awake    Reviewed: Allergy & Precautions, NPO status , Patient's Chart, lab work & pertinent test results  History of Anesthesia Complications Negative for: history of anesthetic complications  Airway Mallampati: II  TM Distance: >3 FB Neck ROM: full    Dental no notable dental hx.    Pulmonary sleep apnea and Continuous Positive Airway Pressure Ventilation    Pulmonary exam normal        Cardiovascular negative cardio ROS Normal cardiovascular exam     Neuro/Psych negative neurological ROS  negative psych ROS   GI/Hepatic negative GI ROS, Neg liver ROS,,,  Endo/Other  negative endocrine ROS    Renal/GU negative Renal ROS  negative genitourinary   Musculoskeletal   Abdominal   Peds  Hematology negative hematology ROS (+)   Anesthesia Other Findings Past Medical History: No date: Allergy No date: Generalized headaches     Comment:  "exertion" No date: Sleep apnea     Comment:  c-pap machine  History reviewed. No pertinent surgical history.  BMI    Body Mass Index: 27.62 kg/m      Reproductive/Obstetrics negative OB ROS                             Anesthesia Physical Anesthesia Plan  ASA: 2  Anesthesia Plan: General   Post-op Pain Management: Minimal or no pain anticipated   Induction: Intravenous  PONV Risk Score and Plan: 1 and Propofol infusion and TIVA  Airway Management Planned: Natural Airway and Nasal Cannula  Additional Equipment:   Intra-op Plan:   Post-operative Plan:   Informed Consent: I have reviewed the patients History and Physical, chart, labs and discussed the procedure including the risks, benefits and alternatives for the proposed anesthesia with the patient or authorized representative who has indicated his/her understanding and acceptance.     Dental Advisory  Given  Plan Discussed with: Anesthesiologist, CRNA and Surgeon  Anesthesia Plan Comments: (Patient consented for risks of anesthesia including but not limited to:  - adverse reactions to medications - risk of airway placement if required - damage to eyes, teeth, lips or other oral mucosa - nerve damage due to positioning  - sore throat or hoarseness - Damage to heart, brain, nerves, lungs, other parts of body or loss of life  Patient voiced understanding and assent.)       Anesthesia Quick Evaluation

## 2023-09-12 NOTE — Op Note (Signed)
 [facility] Gastroenterology Patient Name: George Mercer Procedure Date: 09/12/2023 8:17 AM MRN: [id number] Account #: [id number] Date of Birth: 1981/02/22 Admit Type: Outpatient Age: 43 Room: [facility] ENDO ROOM 4 Gender: Male Note Status: Finalized Instrument Name: Prentice Docker [id number] Procedure:             Colonoscopy Indications:           Screening in patient at increased risk: Family history                         of 1st-degree relative with colorectal cancer before                         age 36 years Providers:             Trenda Moots, DO Referring MD:          No Local Md, MD (Referring MD) Medicines:             Monitored Anesthesia Care Complications:         No immediate complications. Estimated blood loss:                         Minimal. Procedure:             Pre-Anesthesia Assessment:                        - Prior to the procedure, a History and Physical was                         performed, and patient medications and allergies were                         reviewed. The patient is competent. The risks and                         benefits of the procedure and the sedation options and                         risks were discussed with the patient. All questions                         were answered and informed consent was obtained.                         Patient identification and proposed procedure were                         verified by the physician, the nurse, the anesthetist                         and the technician in the endoscopy suite. Mental                         Status Examination: alert and oriented. Airway                         Examination: normal oropharyngeal airway and neck  mobility. Respiratory Examination: clear to                         auscultation. CV Examination: RRR, no murmurs, no S3                         or S4. Prophylactic Antibiotics: The patient does not                          require prophylactic antibiotics. Prior                         Anticoagulants: The patient has taken no anticoagulant                         or antiplatelet agents. ASA Grade Assessment: II - A                         patient with mild systemic disease. After reviewing                         the risks and benefits, the patient was deemed in                         satisfactory condition to undergo the procedure. The                         anesthesia plan was to use monitored anesthesia care                         (MAC). Immediately prior to administration of                         medications, the patient was re-assessed for adequacy                         to receive sedatives. The heart rate, respiratory                         rate, oxygen saturations, blood pressure, adequacy of                         pulmonary ventilation, and response to care were                         monitored throughout the procedure. The physical                         status of the patient was re-assessed after the                         procedure.                        After obtaining informed consent, the colonoscope was                         passed under direct vision. Throughout the procedure,  the patient's blood pressure, pulse, and oxygen                         saturations were monitored continuously. The                         Colonoscope was introduced through the anus and                         advanced to the the terminal ileum, with                         identification of the appendiceal orifice and IC                         valve. The colonoscopy was performed without                         difficulty. The patient tolerated the procedure well.                         The quality of the bowel preparation was evaluated                         using the BBPS [facility] Bowel Preparation Scale) with                         scores of: Right Colon = 3, Transverse  Colon = 3 and                         Left Colon = 3 (entire mucosa seen well with no                         residual staining, small fragments of stool or opaque                         liquid). The total BBPS score equals 9. The terminal                         ileum, ileocecal valve, appendiceal orifice, and                         rectum were photographed. Findings:      The perianal and digital rectal examinations were normal. Pertinent       negatives include normal sphincter tone.      The terminal ileum appeared normal. Estimated blood loss: none.      Retroflexion in the right colon was performed.      Two sessile polyps were found in the descending colon and ascending       colon. The polyps were 1 to 2 mm in size. These polyps were removed with       a jumbo cold forceps. Resection and retrieval were complete. Estimated       blood loss was minimal.      A 7 to 9 mm polyp was found in the rectum. The polyp was       semi-pedunculated. The polyp was removed with a cold snare. Resection  and retrieval were complete. To prevent bleeding after the polypectomy,       two hemostatic clips were successfully placed (MR conditional). There       was no bleeding at the end of the procedure. Estimated blood loss was       minimal.      The exam was otherwise without abnormality on direct and retroflexion       views. Impression:            - The examined portion of the ileum was normal.                        - Two 1 to 2 mm polyps in the descending colon and in                         the ascending colon, removed with a jumbo cold                         forceps. Resected and retrieved.                        - One 7 to 9 mm polyp in the rectum, removed with a                         cold snare. Resected and retrieved. Clips (MR                         conditional) were placed.                        - The examination was otherwise normal on direct and                          retroflexion views. Recommendation:        - Patient has a contact number available for                         emergencies. The signs and symptoms of potential                         delayed complications were discussed with the patient.                         Return to normal activities tomorrow. Written                         discharge instructions were provided to the patient.                        - Discharge patient to home.                        - Resume previous diet.                        - Continue present medications.                        - Await pathology results.                        -  Repeat colonoscopy for surveillance based on                         pathology results.                        - Return to referring physician as previously                         scheduled.                        - Consider genetics evaluation given family history                         and now personal history of polyps.                        - No ibuprofen, naproxen, or other non-steroidal                         anti-inflammatory drugs for 5 days after polyp removal.                        - The findings and recommendations were discussed with                         the patient. Procedure Code(s):     --- Professional ---                        [phone], Colonoscopy, flexible; with removal of                         tumor(s), polyp(s), or other lesion(s) by snare                         technique                        [id number], 59, Colonoscopy, flexible; with biopsy, single                         or multiple Diagnosis Code(s):     --- Professional ---                        Z80.0, Family history of malignant neoplasm of                         digestive organs                        D12.4, Benign neoplasm of descending colon                        D12.2, Benign neoplasm of ascending colon                        D12.8, Benign neoplasm of rectum CPT copyright 2022 American  Medical Association. All rights reserved. The codes documented in this report are preliminary and upon coder review may  be revised to meet current compliance requirements. Attending Participation:  I personally performed the entire procedure. Elfredia Nevins, DO Jaynie Collins DO, DO 09/12/2023 8:51:14 AM This report has been signed electronically. Number of Addenda: 0 Note Initiated On: 09/12/2023 8:17 AM Scope Withdrawal Time: 0 hours 16 minutes 49 seconds  Total Procedure Duration: 0 hours 22 minutes 6 seconds  Estimated Blood Loss:  Estimated blood loss was minimal.      [facility]

## 2023-09-12 NOTE — Anesthesia Postprocedure Evaluation (Signed)
 Anesthesia Post Note  Patient: George Mercer  Procedure(s) Performed: COLONOSCOPY WITH PROPOFOL POLYPECTOMY HEMOSTASIS CLIP PLACEMENT  Patient location during evaluation: Endoscopy Anesthesia Type: General Level of consciousness: awake and alert Pain management: pain level controlled Vital Signs Assessment: post-procedure vital signs reviewed and stable Respiratory status: spontaneous breathing, nonlabored ventilation, respiratory function stable and patient connected to nasal cannula oxygen Cardiovascular status: blood pressure returned to baseline and stable Postop Assessment: no apparent nausea or vomiting Anesthetic complications: no   No notable events documented.   Last Vitals:  Vitals:   09/12/23 0907 09/12/23 0912  BP: (!) 98/54 103/77  Pulse: 80 73  Resp:    Temp:    SpO2: 98% 98%    Last Pain:  Vitals:   09/12/23 0907  TempSrc:   PainSc: 0-No pain                 Louie Boston

## 2023-09-12 NOTE — Transfer of Care (Signed)
 Immediate Anesthesia Transfer of Care Note  Patient: George Mercer  Procedure(s) Performed: COLONOSCOPY WITH PROPOFOL POLYPECTOMY HEMOSTASIS CLIP PLACEMENT  Patient Location: PACU and Endoscopy Unit  Anesthesia Type:General  Level of Consciousness: awake and drowsy  Airway & Oxygen Therapy: Patient Spontanous Breathing  Post-op Assessment: Report given to RN and Post -op Vital signs reviewed and stable  Post vital signs: Reviewed and stable  Last Vitals:  Vitals Value Taken Time  BP    Temp 36.1 C 09/12/23 0855  Pulse 71 09/12/23 0855  Resp    SpO2 97 % 09/12/23 0855    Last Pain:  Vitals:   09/12/23 0855  TempSrc: Temporal  PainSc:          Complications: No notable events documented.

## 2023-09-12 NOTE — H&P (Signed)
 Pre-Procedure H&P   Patient ID: George Mercer is a 43 y.o. male.  Gastroenterology Provider: Jaynie Collins, DO  Referring Provider: Laren Everts, NP PCP: Laren Everts, NP  Date: 09/12/2023 colorectal cancer screening  HPI George Mercer is a 43 y.o. male who presents today for Colonoscopy for colorectal cancer screening, family history of colon cancer.  This is patient's initial colonoscopy.  Father with history of colon cancer (~35 y/o).  No other family history of colon cancer or colon polyps.  Patient reports regular bowel movements without melena or hematochezia.  Hemoglobin 15.4 MCV 87.5 platelets 246,000 creatinine 1.0   Past Medical History:  Diagnosis Date   Allergy    Generalized headaches    "exertion"   Sleep apnea    c-pap machine    History reviewed. No pertinent surgical history.  Family History Father- colon cancer (~70 y/o) No other h/o GI disease or malignancy  Review of Systems  Constitutional:  Negative for activity change, appetite change, chills, diaphoresis, fatigue, fever and unexpected weight change.  HENT:  Negative for trouble swallowing and voice change.   Respiratory:  Negative for shortness of breath and wheezing.   Cardiovascular:  Negative for chest pain, palpitations and leg swelling.  Gastrointestinal:  Negative for abdominal distention, abdominal pain, anal bleeding, blood in stool, constipation, diarrhea, nausea and vomiting.  Musculoskeletal:  Negative for arthralgias and myalgias.  Skin:  Negative for color change and pallor.  Neurological:  Negative for dizziness, syncope and weakness.  Psychiatric/Behavioral:  Negative for confusion. The patient is not nervous/anxious.   All other systems reviewed and are negative.    Medications No current facility-administered medications on file prior to encounter.   Current Outpatient Medications on File Prior to Encounter  Medication Sig Dispense Refill    fluticasone (FLONASE) 50 MCG/ACT nasal spray Place 1 spray into both nostrils daily. otc 16 g 11   meloxicam (MOBIC) 15 MG tablet Take 1 tablet (15 mg total) by mouth daily as needed for pain. 30 tablet 0   montelukast (SINGULAIR) 10 MG tablet Take 1 tablet (10 mg total) by mouth at bedtime. 30 tablet 11   [DISCONTINUED] modafinil (PROVIGIL) 100 MG tablet Take 1 tablet by mouth once daily (Patient not taking: Reported on 11/07/2019) 30 tablet 1    Pertinent medications related to GI and procedure were reviewed by me with the patient prior to the procedure   Current Facility-Administered Medications:    0.9 %  sodium chloride infusion, , Intravenous, Continuous, Jaynie Collins, DO  sodium chloride         No Known Allergies Allergies were reviewed by me prior to the procedure  Objective   Body mass index is 27.62 kg/m. Vitals:   09/12/23 0741 09/12/23 0751  BP:  133/78  Pulse:  74  Resp:  16  Temp:  (!) 96.6 F (35.9 C)  TempSrc:  Temporal  SpO2:  98%  Weight: 89.8 kg   Height: 5\' 11"  (1.803 m)      Physical Exam Vitals and nursing note reviewed.  Constitutional:      General: He is not in acute distress.    Appearance: Normal appearance. He is not ill-appearing, toxic-appearing or diaphoretic.  HENT:     Head: Normocephalic and atraumatic.     Nose: Nose normal.     Mouth/Throat:     Mouth: Mucous membranes are moist.     Pharynx: Oropharynx is clear.  Eyes:     General: No  scleral icterus.    Extraocular Movements: Extraocular movements intact.  Cardiovascular:     Rate and Rhythm: Normal rate and regular rhythm.     Heart sounds: Normal heart sounds. No murmur heard.    No friction rub. No gallop.  Pulmonary:     Effort: Pulmonary effort is normal. No respiratory distress.     Breath sounds: Normal breath sounds. No wheezing, rhonchi or rales.  Abdominal:     General: Bowel sounds are normal. There is no distension.     Palpations: Abdomen is soft.      Tenderness: There is no abdominal tenderness. There is no guarding or rebound.  Musculoskeletal:     Cervical back: Neck supple.     Right lower leg: No edema.     Left lower leg: No edema.  Skin:    General: Skin is warm and dry.     Coloration: Skin is not jaundiced or pale.  Neurological:     General: No focal deficit present.     Mental Status: He is alert and oriented to person, place, and time. Mental status is at baseline.  Psychiatric:        Mood and Affect: Mood normal.        Behavior: Behavior normal.        Thought Content: Thought content normal.        Judgment: Judgment normal.      Assessment:  George Mercer is a 43 y.o. male  who presents today for Colonoscopy for colorectal cancer screening.  Plan:  Colonoscopy with possible intervention today  Colonoscopy with possible biopsy, control of bleeding, polypectomy, and interventions as necessary has been discussed with the patient/patient representative. Informed consent was obtained from the patient/patient representative after explaining the indication, nature, and risks of the procedure including but not limited to death, bleeding, perforation, missed neoplasm/lesions, cardiorespiratory compromise, and reaction to medications. Opportunity for questions was given and appropriate answers were provided. Patient/patient representative has verbalized understanding is amenable to undergoing the procedure.   Jaynie Collins, DO  [facility]  Portions of the record may have been created with voice recognition software. Occasional wrong-word or 'sound-a-like' substitutions may have occurred due to the inherent limitations of voice recognition software.  Read the chart carefully and recognize, using context, where substitutions may have occurred.

## 2023-09-12 NOTE — Interval H&P Note (Signed)
 History and Physical Interval Note: Preprocedure H&P from 09/12/23  was reviewed and there was no interval change after seeing and examining the patient.  Written consent was obtained from the patient after discussion of risks, benefits, and alternatives. Patient has consented to proceed with Colonoscopy with possible intervention   09/12/2023 8:17 AM  George Mercer  has presented today for surgery, with the diagnosis of Z12.11 (ICD-10-CM) - Colon cancer screening Z80.0 (ICD-10-CM) - Family history of colon cancer.  The various methods of treatment have been discussed with the patient and family. After consideration of risks, benefits and other options for treatment, the patient has consented to  Procedure(s): COLONOSCOPY WITH PROPOFOL (N/A) as a surgical intervention.  The patient's history has been reviewed, patient examined, no change in status, stable for surgery.  I have reviewed the patient's chart and labs.  Questions were answered to the patient's satisfaction.     Jaynie Collins

## 2023-09-13 LAB — SURGICAL PATHOLOGY
